# Patient Record
Sex: Male | Born: 2005 | Race: White | Hispanic: No | Marital: Single | Smoking: Never smoker
Health system: Southern US, Community
[De-identification: ages and names within clinical notes are randomized; demographics above are authoritative.]

## PROBLEM LIST (undated history)

## (undated) DIAGNOSIS — F840 Autistic disorder: Secondary | ICD-10-CM

---
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
## 2023-01-07 DIAGNOSIS — F8082 Social pragmatic communication disorder: Secondary | ICD-10-CM | POA: Diagnosis not present

## 2023-01-14 DIAGNOSIS — F8082 Social pragmatic communication disorder: Secondary | ICD-10-CM | POA: Diagnosis not present

## 2023-01-16 DIAGNOSIS — F840 Autistic disorder: Secondary | ICD-10-CM | POA: Diagnosis not present

## 2023-01-16 DIAGNOSIS — F4011 Social phobia, generalized: Secondary | ICD-10-CM | POA: Diagnosis not present

## 2023-01-21 DIAGNOSIS — F8082 Social pragmatic communication disorder: Secondary | ICD-10-CM | POA: Diagnosis not present

## 2023-02-25 DIAGNOSIS — Z0000 Encounter for general adult medical examination without abnormal findings: Secondary | ICD-10-CM | POA: Diagnosis not present

## 2023-04-03 DIAGNOSIS — F8082 Social pragmatic communication disorder: Secondary | ICD-10-CM | POA: Diagnosis not present

## 2023-04-10 DIAGNOSIS — F8082 Social pragmatic communication disorder: Secondary | ICD-10-CM | POA: Diagnosis not present

## 2023-04-15 DIAGNOSIS — F840 Autistic disorder: Secondary | ICD-10-CM | POA: Diagnosis not present

## 2023-04-15 DIAGNOSIS — F4011 Social phobia, generalized: Secondary | ICD-10-CM | POA: Diagnosis not present

## 2023-04-17 DIAGNOSIS — F8082 Social pragmatic communication disorder: Secondary | ICD-10-CM | POA: Diagnosis not present

## 2023-04-24 DIAGNOSIS — F8082 Social pragmatic communication disorder: Secondary | ICD-10-CM | POA: Diagnosis not present

## 2023-05-01 DIAGNOSIS — F8082 Social pragmatic communication disorder: Secondary | ICD-10-CM | POA: Diagnosis not present

## 2023-05-14 DIAGNOSIS — F840 Autistic disorder: Secondary | ICD-10-CM | POA: Diagnosis not present

## 2023-05-14 DIAGNOSIS — F4011 Social phobia, generalized: Secondary | ICD-10-CM | POA: Diagnosis not present

## 2023-05-15 DIAGNOSIS — F8082 Social pragmatic communication disorder: Secondary | ICD-10-CM | POA: Diagnosis not present

## 2023-05-29 DIAGNOSIS — F8082 Social pragmatic communication disorder: Secondary | ICD-10-CM | POA: Diagnosis not present

## 2023-06-05 DIAGNOSIS — F8082 Social pragmatic communication disorder: Secondary | ICD-10-CM | POA: Diagnosis not present

## 2023-06-12 DIAGNOSIS — F8082 Social pragmatic communication disorder: Secondary | ICD-10-CM | POA: Diagnosis not present
